# Patient Record
Sex: Female | Born: 1950 | Race: White | Hispanic: No | Smoking: Never smoker
Health system: Southern US, Community
[De-identification: ages and names within clinical notes are randomized; demographics above are authoritative.]

---

## 2006-07-06 ENCOUNTER — Encounter: Admission: RE | Admit: 2006-07-06 | Discharge: 2006-07-06 | Payer: Self-pay | Admitting: *Deleted

## 2006-08-29 ENCOUNTER — Encounter (INDEPENDENT_AMBULATORY_CARE_PROVIDER_SITE_OTHER): Payer: Self-pay | Admitting: Interventional Radiology

## 2006-08-29 ENCOUNTER — Other Ambulatory Visit: Admission: RE | Admit: 2006-08-29 | Discharge: 2006-08-29 | Payer: Self-pay | Admitting: Interventional Radiology

## 2006-08-29 ENCOUNTER — Encounter: Admission: RE | Admit: 2006-08-29 | Discharge: 2006-08-29 | Payer: Self-pay | Admitting: Endocrinology

## 2008-02-01 ENCOUNTER — Encounter: Admission: RE | Admit: 2008-02-01 | Discharge: 2008-02-01 | Payer: Self-pay | Admitting: Endocrinology

## 2008-12-20 ENCOUNTER — Emergency Department (HOSPITAL_COMMUNITY): Admission: EM | Admit: 2008-12-20 | Discharge: 2008-12-20 | Payer: Self-pay | Admitting: Emergency Medicine

## 2009-11-17 ENCOUNTER — Encounter: Admission: RE | Admit: 2009-11-17 | Discharge: 2009-11-17 | Payer: Self-pay | Admitting: Obstetrics and Gynecology

## 2010-01-01 ENCOUNTER — Encounter: Admission: RE | Admit: 2010-01-01 | Discharge: 2010-01-01 | Payer: Self-pay | Admitting: Endocrinology

## 2010-05-02 ENCOUNTER — Encounter: Payer: Self-pay | Admitting: Endocrinology

## 2010-05-02 ENCOUNTER — Encounter: Payer: Self-pay | Admitting: *Deleted

## 2010-12-20 ENCOUNTER — Other Ambulatory Visit: Payer: Self-pay | Admitting: Endocrinology

## 2010-12-20 DIAGNOSIS — E049 Nontoxic goiter, unspecified: Secondary | ICD-10-CM

## 2011-01-17 ENCOUNTER — Other Ambulatory Visit: Payer: Self-pay

## 2011-01-24 ENCOUNTER — Ambulatory Visit
Admission: RE | Admit: 2011-01-24 | Discharge: 2011-01-24 | Disposition: A | Discharge status: Home / Self Care | Payer: 59 | Source: Ambulatory Visit | Attending: Endocrinology | Admitting: Endocrinology

## 2011-01-24 DIAGNOSIS — E049 Nontoxic goiter, unspecified: Secondary | ICD-10-CM

## 2012-03-28 ENCOUNTER — Other Ambulatory Visit: Payer: Self-pay | Admitting: Endocrinology

## 2012-03-28 DIAGNOSIS — E049 Nontoxic goiter, unspecified: Secondary | ICD-10-CM

## 2012-04-18 ENCOUNTER — Ambulatory Visit
Admission: RE | Admit: 2012-04-18 | Discharge: 2012-04-18 | Disposition: A | Discharge status: Home / Self Care | Payer: PRIVATE HEALTH INSURANCE | Source: Ambulatory Visit | Attending: Endocrinology | Admitting: Endocrinology

## 2012-04-18 ENCOUNTER — Other Ambulatory Visit: Payer: 59

## 2012-04-18 DIAGNOSIS — E049 Nontoxic goiter, unspecified: Secondary | ICD-10-CM

## 2012-04-25 ENCOUNTER — Other Ambulatory Visit: Payer: 59

## 2014-04-21 ENCOUNTER — Other Ambulatory Visit: Payer: Self-pay | Admitting: Endocrinology

## 2014-04-21 DIAGNOSIS — E049 Nontoxic goiter, unspecified: Secondary | ICD-10-CM

## 2015-04-20 ENCOUNTER — Other Ambulatory Visit: Payer: PRIVATE HEALTH INSURANCE

## 2015-04-22 ENCOUNTER — Ambulatory Visit
Admission: RE | Admit: 2015-04-22 | Discharge: 2015-04-22 | Disposition: A | Discharge status: Home / Self Care | Payer: 59 | Source: Ambulatory Visit | Attending: Endocrinology | Admitting: Endocrinology

## 2015-04-22 DIAGNOSIS — E049 Nontoxic goiter, unspecified: Secondary | ICD-10-CM

## 2015-07-18 ENCOUNTER — Emergency Department (HOSPITAL_COMMUNITY): Payer: 59

## 2015-07-18 ENCOUNTER — Encounter (HOSPITAL_COMMUNITY): Payer: Self-pay | Admitting: *Deleted

## 2015-07-18 ENCOUNTER — Emergency Department (HOSPITAL_COMMUNITY)
Admission: EM | Admit: 2015-07-18 | Discharge: 2015-07-18 | Disposition: A | Discharge status: Home / Self Care | Payer: 59 | Attending: Emergency Medicine | Admitting: Emergency Medicine

## 2015-07-18 DIAGNOSIS — Y9289 Other specified places as the place of occurrence of the external cause: Secondary | ICD-10-CM | POA: Diagnosis not present

## 2015-07-18 DIAGNOSIS — Y9389 Activity, other specified: Secondary | ICD-10-CM | POA: Insufficient documentation

## 2015-07-18 DIAGNOSIS — Y998 Other external cause status: Secondary | ICD-10-CM | POA: Insufficient documentation

## 2015-07-18 DIAGNOSIS — S52571A Other intraarticular fracture of lower end of right radius, initial encounter for closed fracture: Secondary | ICD-10-CM | POA: Diagnosis not present

## 2015-07-18 DIAGNOSIS — W1789XA Other fall from one level to another, initial encounter: Secondary | ICD-10-CM | POA: Diagnosis not present

## 2015-07-18 DIAGNOSIS — S52501A Unspecified fracture of the lower end of right radius, initial encounter for closed fracture: Secondary | ICD-10-CM

## 2015-07-18 DIAGNOSIS — S6991XA Unspecified injury of right wrist, hand and finger(s), initial encounter: Secondary | ICD-10-CM | POA: Diagnosis present

## 2015-07-18 MED ORDER — OXYCODONE-ACETAMINOPHEN 5-325 MG PO TABS: 1.0000 | ORAL_TABLET | ORAL | Status: AC | PRN

## 2015-07-18 MED ORDER — ONDANSETRON 4 MG PO TBDP
8.0000 mg | ORAL_TABLET | Freq: Once | ORAL | Status: AC
  Administered 2015-07-18: 8 mg via ORAL
  Filled 2015-07-18: qty 2

## 2015-07-18 MED ORDER — NAPROXEN 500 MG PO TABS: 500.0000 mg | ORAL_TABLET | Freq: Two times a day (BID) | ORAL | Status: AC

## 2015-07-18 MED ORDER — OXYCODONE-ACETAMINOPHEN 5-325 MG PO TABS
1.0000 | ORAL_TABLET | Freq: Once | ORAL | Status: AC
  Administered 2015-07-18: 1 via ORAL
  Filled 2015-07-18: qty 1

## 2015-07-18 NOTE — ED Provider Notes (Signed)
CSN: [id number]     Arrival date & time 07/18/15  1901 History   First MD Initiated Contact with Patient 07/18/15 1923     Chief Complaint  Patient presents with  . Wrist Injury     (Consider location/radiation/quality/duration/timing/severity/associated sxs/prior Treatment) HPI   Daisy Gonzales is a 65 y.o. female, patient with no pertinent past medical history, presenting to the ED with right wrist pain from a fall that occurred just PTA. Pt rates her pain at 10/10, throbbing, nonradiating. Patient states that she was scraping popcorn ceilings and, while sitting on top of her fridge, lost her balance and fell to the floor. Patient denies LOC or head trauma. Denies neuro deficits, previous injuries or surgeries to this wrist, or any other complaints or injuries.    History reviewed. No pertinent past medical history. History reviewed. No pertinent past surgical history. No family history on file. Social History  Substance Use Topics  . Smoking status: Never Smoker   . Smokeless tobacco: None  . Alcohol Use: No   OB History    No data available     Review of Systems  Musculoskeletal: Positive for joint swelling (right wrist) and arthralgias (right wrist).  Skin: Positive for color change.  Neurological: Negative for weakness and numbness.      Allergies  Review of patient's allergies indicates no known allergies.  Home Medications   Prior to Admission medications   Medication Sig Start Date End Date Taking? Authorizing Provider  naproxen (NAPROSYN) 500 MG tablet Take 1 tablet (500 mg total) by mouth 2 (two) times daily. 07/18/15   Shawn C Joy, PA-C  oxyCODONE-acetaminophen (PERCOCET/ROXICET) 5-325 MG tablet Take 1 tablet by mouth every 4 (four) hours as needed for severe pain. 07/18/15   Shawn C Joy, PA-C   BP 188/99 mmHg  Pulse 69  Temp(Src) 97.9 F (36.6 C) (Oral)  Resp 24  Ht  (1.626 m)  Wt 63.504 kg  BMI 24.02 kg/m2  SpO2 98% Physical Exam    Constitutional: She appears well-developed and well-nourished. No distress.  HENT:  Head: Normocephalic and atraumatic.  Eyes: Conjunctivae are normal.  Cardiovascular: Normal rate, regular rhythm and intact distal pulses.   Circulation intact distal to the injury, verified by capillary refill.  Pulmonary/Chest: Effort normal.  Musculoskeletal: She exhibits edema and tenderness.  Obvious deformity and swelling with a possible hematoma on the anterior right wrist. Range of motion intact in the patient's right fingers. Overall trauma exam reveals no other injuries.  Neurological: She is alert.  No sensory deficits. Strength 5 out of 5 in the right hand.  Skin: Skin is warm and dry. She is not diaphoretic.  Psychiatric: She has a normal mood and affect. Her behavior is normal.  Nursing note and vitals reviewed.   ED Course  .Splint Application Date/Time: 07/18/2015 8:59 PM Performed by: Anselm Pancoast Authorized by: Anselm Pancoast Consent: Verbal consent obtained. Risks and benefits: risks, benefits and alternatives were discussed Consent given by: patient Patient understanding: patient states understanding of the procedure being performed Patient consent: the patient's understanding of the procedure matches consent given Procedure consent: procedure consent matches procedure scheduled Patient identity confirmed: verbally with patient and arm band Location details: right wrist Splint type: thumb spica Supplies used: Ortho-Glass Post-procedure: The splinted body part was neurovascularly unchanged following the procedure. Patient tolerance: Patient tolerated the procedure well with no immediate complications Comments: Splint was placed by the Ortho Tech. I assessed before and after. I  was available for consult throughout.   (including critical care time) Labs Review Labs Reviewed - No data to display  Imaging Review Dg Forearm Right  07/18/2015  CLINICAL DATA:  R forearm pain distal;  edema and hematoma; deformity fall (fell off refrigerator removing popcorn ceiling) X today EXAM: RIGHT FOREARM - 2 VIEW COMPARISON:  None. FINDINGS: There is a fracture of the distal radius. It is comminuted, impacted and mildly displaced. No other fractures. Elbow joint is normally aligned. No dislocation of the wrist joints. IMPRESSION: Comminuted, mildly displaced and impacted distal radial fracture. Electronically Signed   By: Amie Portlandavid  Ormond M.D.   On: 07/18/2015 19:52   Dg Hand Complete Right  07/18/2015  CLINICAL DATA:  Right hand pain EXAM: RIGHT HAND - COMPLETE 3+ VIEW COMPARISON:  None. FINDINGS: Acute, comminuted intra-articular fracture of the distal radius is identified. No additional fractures or subluxations. No radiopaque foreign bodies identified. IMPRESSION: 1. Acute intra-articular fracture of the distal radius is identified. Electronically Signed   By: Signa Kellaylor  Stroud M.D.   On: 07/18/2015 19:53   I have personally reviewed and evaluated these images as part of my medical decision-making.   EKG Interpretation None          MDM   Final diagnoses:  Distal radial fracture, right, closed, initial encounter    Daisy Gonzales presents with a right wrist injury from a fall that occurred just prior to arrival.  Findings and plan of care discussed with Gwyneth SproutWhitney Plunkett, MD. Dr. Anitra LauthPlunkett personally evaluated and examined this patient.  This patient's presentation is suspicious for a wrist fracture. X-ray confirms this, showing an intra-articular fracture of the distal radius. Patient's pain controlled with moderate measures. Patient is neurovascularly intact distal to the injury. Hand consult warranted. 8:26 PM Spoke with Dr. Izora Ribasoley, hand surgeon, who recommended splinting the wrist, assuring the splint is not placed too tightly, and have the patient call the office to be seen on Monday, April 10.  Splint was placed and the recommendation for making an appointment was  communicated with the patient. Home care and return precautions discussed. Patient voiced understanding of these instructions and is comfortable with discharge.  Filed Vitals:   07/18/15 1905  BP: 188/99  Pulse: 69  Temp: 97.9 F (36.6 C)  TempSrc: Oral  Resp: 24  Height: 5\' 4"  (1.626 m)  Weight: 63.504 kg  SpO2: 98%        Anselm PancoastShawn C Joy, PA-C 07/18/15 2103  Gwyneth SproutWhitney Plunkett, MD 07/18/15 2133

## 2015-07-18 NOTE — Progress Notes (Signed)
Orthopedic Tech Progress Note Patient Details:  [facility] JobsChrist[facility] CenterBandy 08/17/1950 [id number] Applied fiberglass thumb spica splint to RUE.  Pulses, sensation, motion intact before and after splinting.  Capillary refill less than 2 seconds before and after splinting. Ortho Devices Type of Ortho Device: Thumb spica splint Splint Material: Fiberglass Ortho Device/Splint Location: RUE Ortho Device/Splint Interventions: Application   Lesle ChrisGilliland, Emrah Ariola L 07/18/2015, 9:19 PM

## 2015-07-18 NOTE — ED Notes (Signed)
The pt is c/o rt wrist pain she fell while repalcing popcorn ceilings just pta here.  Good radial pulse.  Splinted ice pack

## 2015-07-18 NOTE — Discharge Instructions (Signed)
You have been seen today for a wrist injury. There is a fracture in your wrist. You should follow up with the Hand Surgeon. Call his office on Monday, April 10 to set up an appointment for later that day. Follow up with PCP as needed. Return to ED should symptoms worsen.

## 2015-07-18 NOTE — ED Notes (Signed)
Patient left at this time with all belongings. 

## 2015-07-18 NOTE — ED Notes (Signed)
Paged ortho tech for short arm splint  

## 2016-05-09 ENCOUNTER — Other Ambulatory Visit: Payer: Self-pay | Admitting: Endocrinology

## 2016-05-09 DIAGNOSIS — Z1231 Encounter for screening mammogram for malignant neoplasm of breast: Secondary | ICD-10-CM

## 2016-05-09 DIAGNOSIS — R5381 Other malaise: Secondary | ICD-10-CM

## 2017-04-12 ENCOUNTER — Other Ambulatory Visit: Payer: Self-pay | Admitting: Endocrinology

## 2017-04-12 DIAGNOSIS — E049 Nontoxic goiter, unspecified: Secondary | ICD-10-CM

## 2017-05-03 ENCOUNTER — Ambulatory Visit
Admission: RE | Admit: 2017-05-03 | Discharge: 2017-05-03 | Disposition: A | Discharge status: Home / Self Care | Payer: Medicare Other | Source: Ambulatory Visit | Attending: Endocrinology | Admitting: Endocrinology

## 2017-05-03 DIAGNOSIS — E049 Nontoxic goiter, unspecified: Secondary | ICD-10-CM

## 2017-07-13 IMAGING — CR DG HAND COMPLETE 3+V*R*
3 series · 3 of 3 positions shown · non-contrast
Comparison: None.

CLINICAL DATA: Right hand pain

EXAM:
RIGHT HAND - COMPLETE 3+ VIEW

[hand pa]
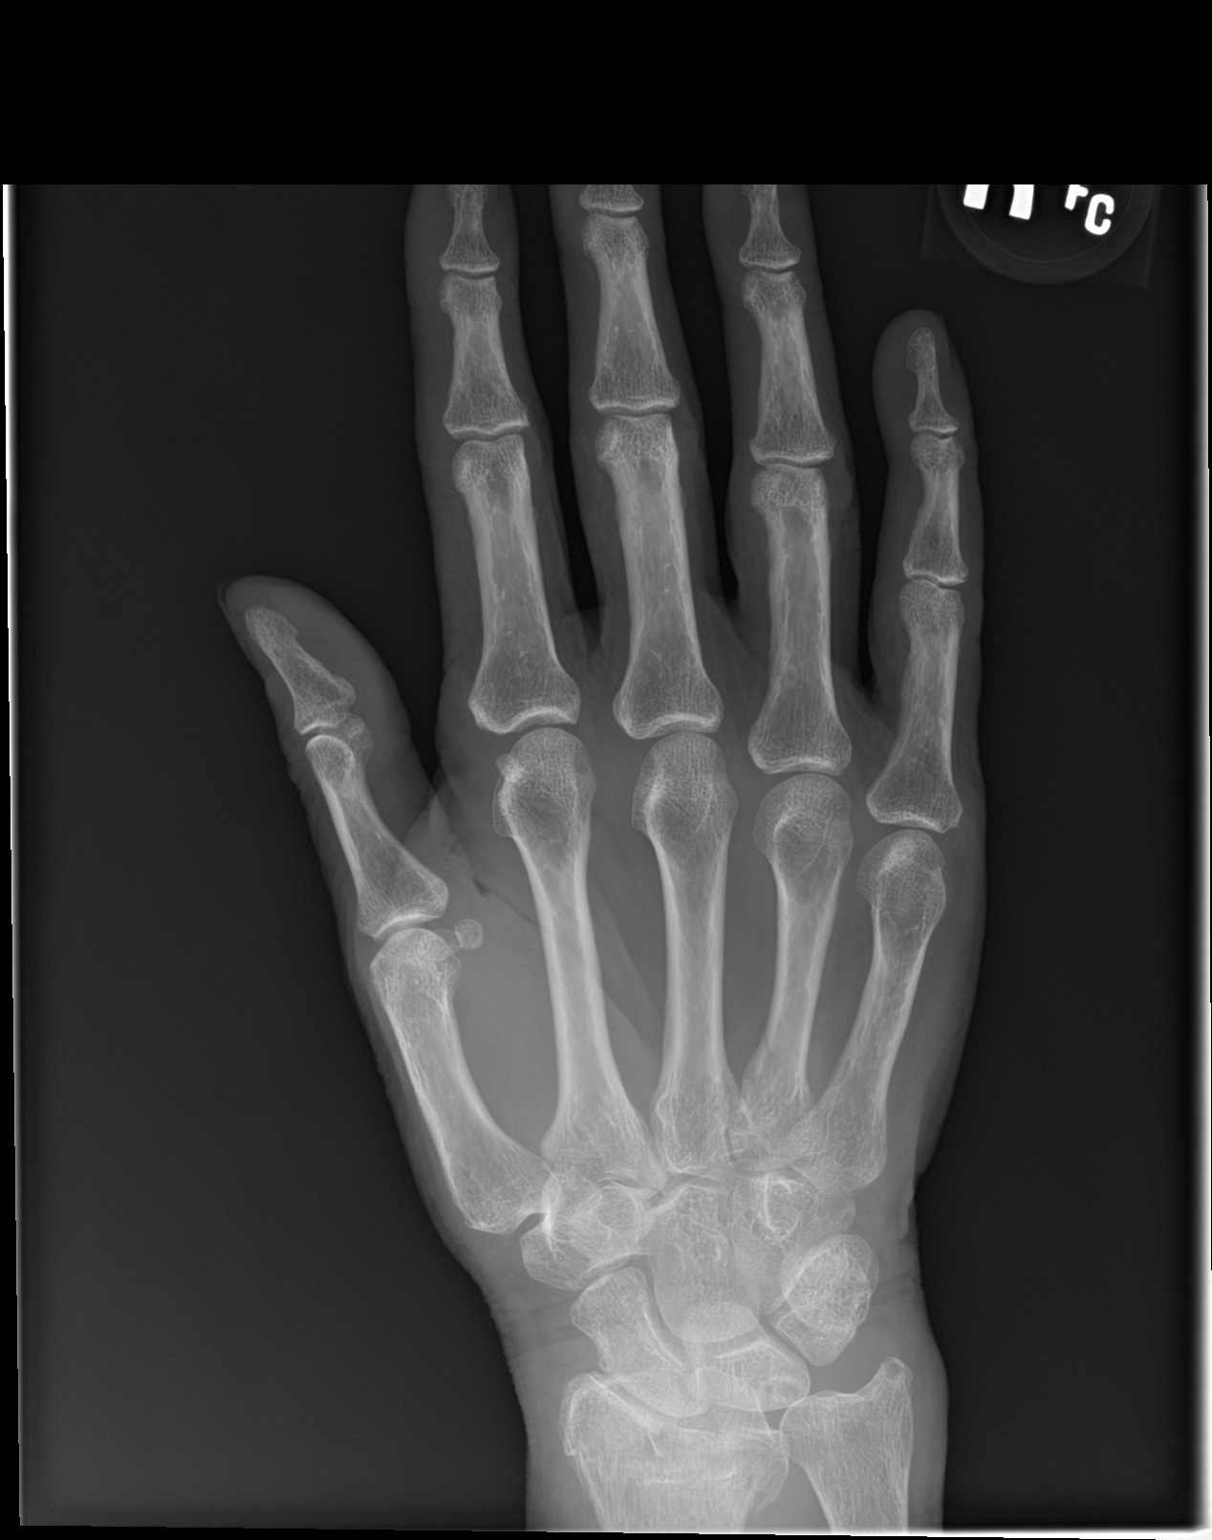

[hand obl]
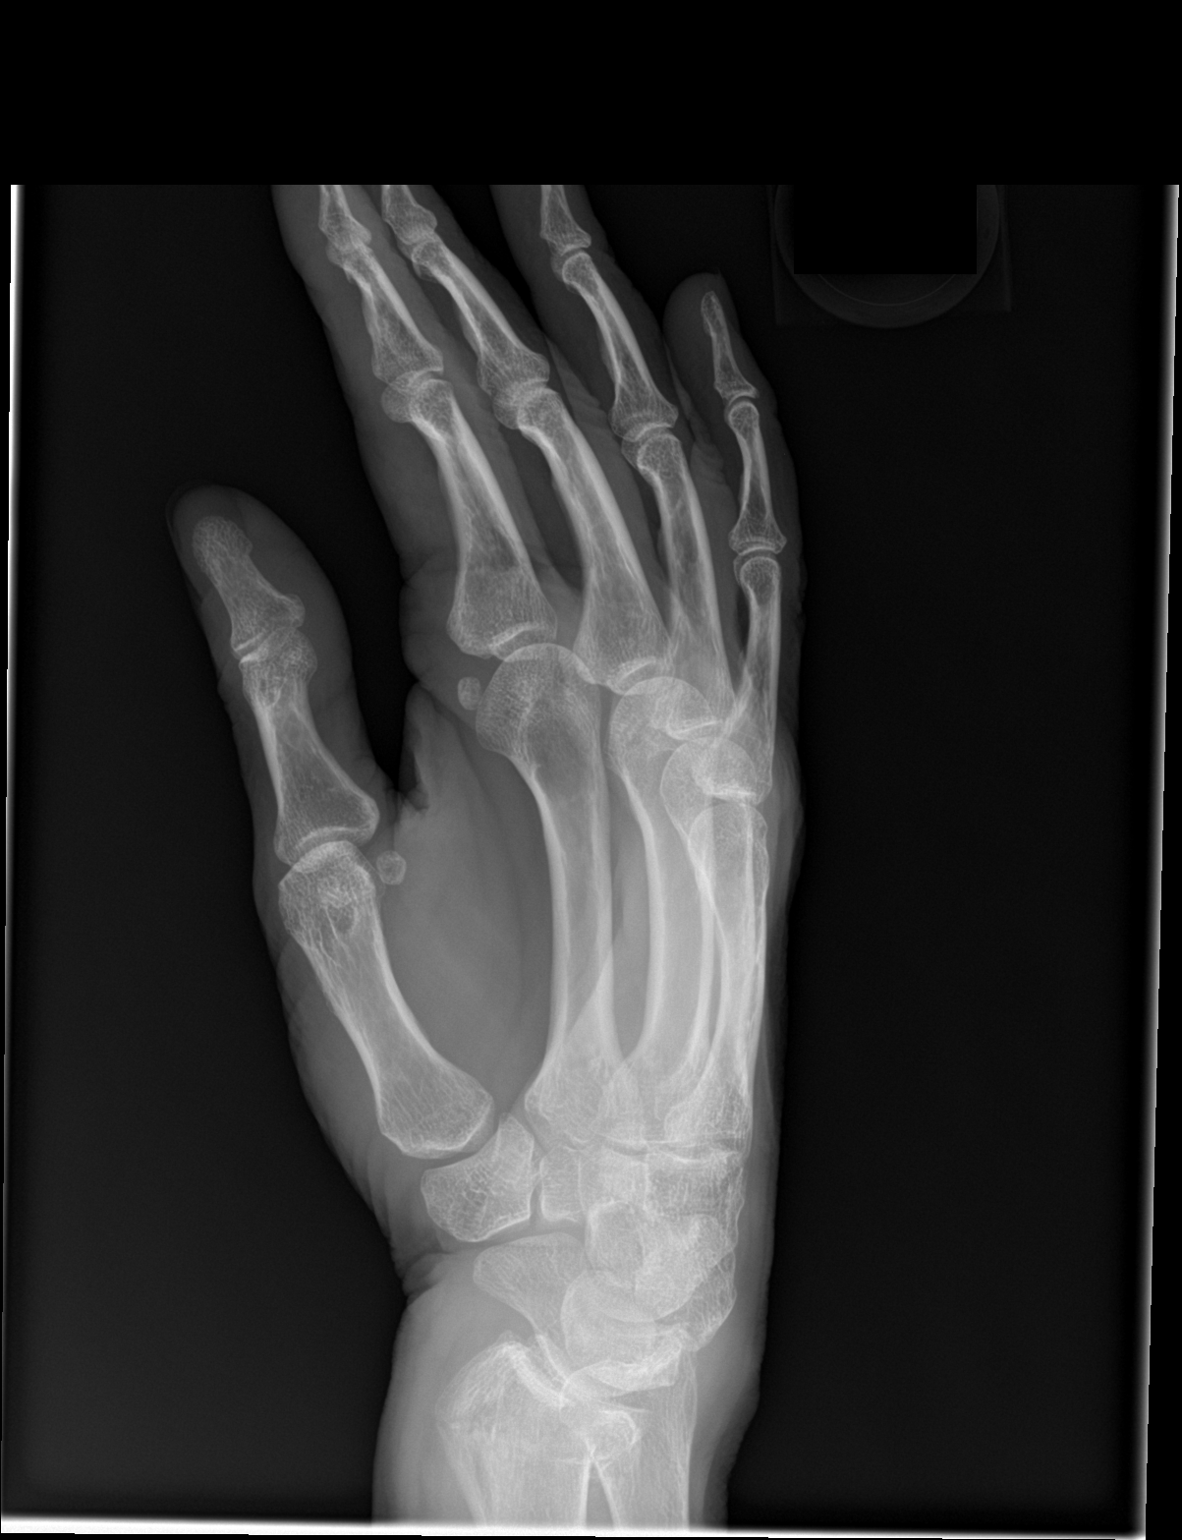

[hand lat]
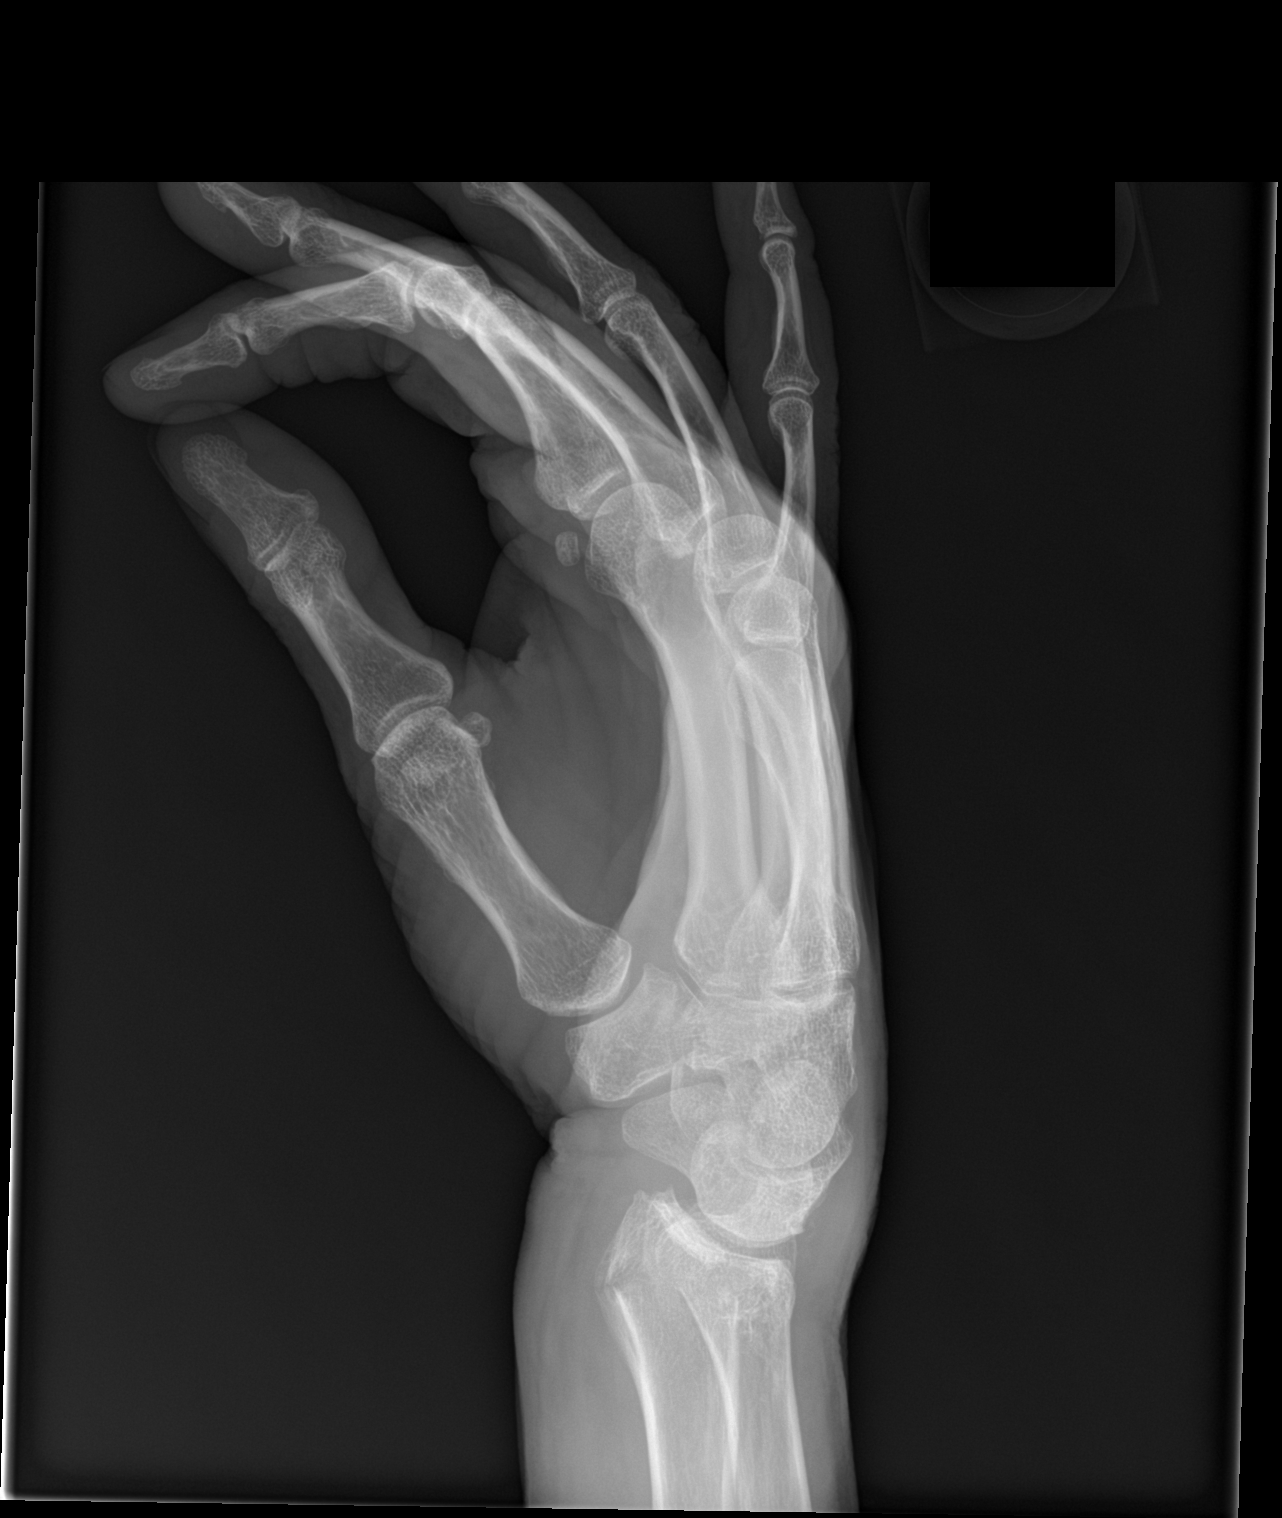

[3 of 3 positions shown; findings below may reference images not displayed]

FINDINGS: Acute, comminuted intra-articular fracture of the distal radius is
identified. No additional fractures or subluxations. No radiopaque
foreign bodies identified.
IMPRESSION: 1. Acute intra-articular fracture of the distal radius is
identified.

## 2018-05-10 ENCOUNTER — Other Ambulatory Visit: Payer: Self-pay | Admitting: Endocrinology

## 2018-05-10 DIAGNOSIS — E049 Nontoxic goiter, unspecified: Secondary | ICD-10-CM

## 2018-05-17 ENCOUNTER — Other Ambulatory Visit: Payer: 59

## 2018-06-05 ENCOUNTER — Ambulatory Visit
Admission: RE | Admit: 2018-06-05 | Discharge: 2018-06-05 | Disposition: A | Discharge status: Home / Self Care | Payer: 59 | Source: Ambulatory Visit | Attending: Endocrinology | Admitting: Endocrinology

## 2018-06-05 DIAGNOSIS — E049 Nontoxic goiter, unspecified: Secondary | ICD-10-CM

## 2019-05-11 ENCOUNTER — Other Ambulatory Visit: Payer: Self-pay | Admitting: Endocrinology

## 2019-05-11 DIAGNOSIS — E049 Nontoxic goiter, unspecified: Secondary | ICD-10-CM

## 2019-10-12 IMAGING — US US THYROID
1 series · 13 of 25 positions shown · non-contrast
Comparison: 04/22/2015 and previous back to 01/01/2010

CLINICAL DATA: Goiter. Previous FNA biopsy of inferior right and
mid left nodules nodule 08/29/2006.

EXAM:
THYROID ULTRASOUND
TECHNIQUE: Ultrasound examination of the thyroid gland and adjacent soft
tissues was performed.

[Series 1: us thyroid · 0.08mm/px · 13 of 60 slices shown]
[im 1/60]
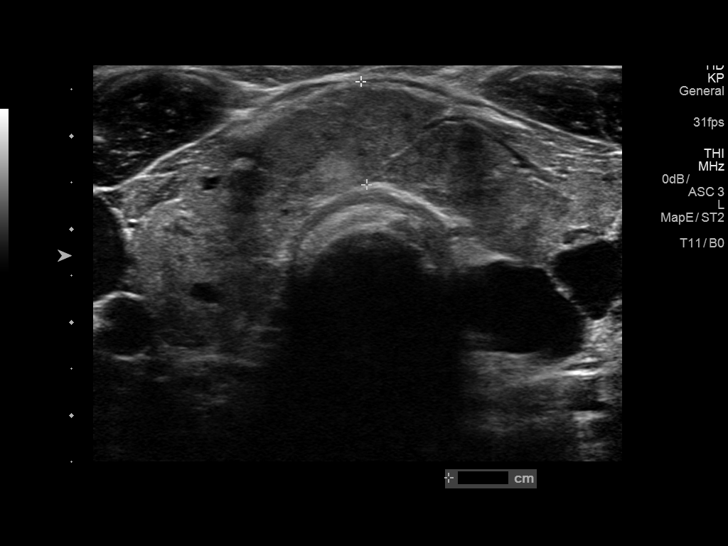
[im 5/60]
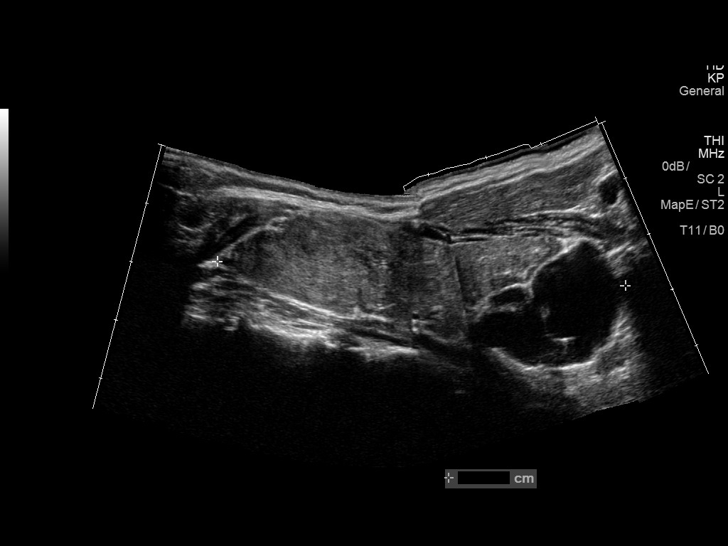
[im 10/60]
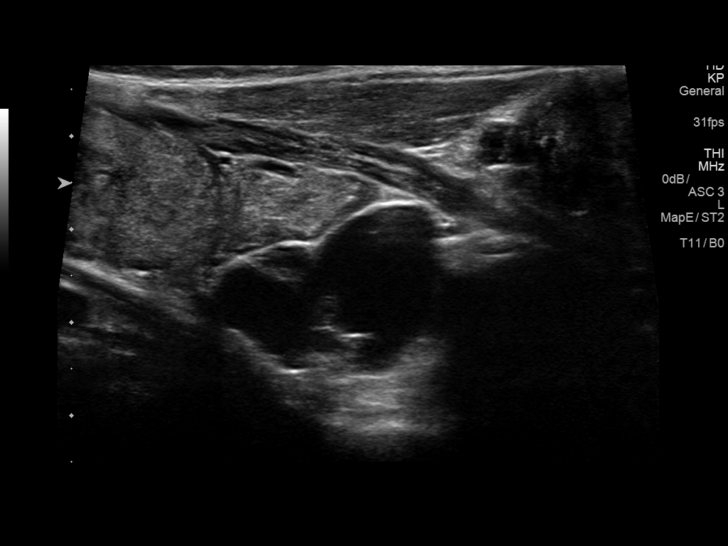
[im 15/60]
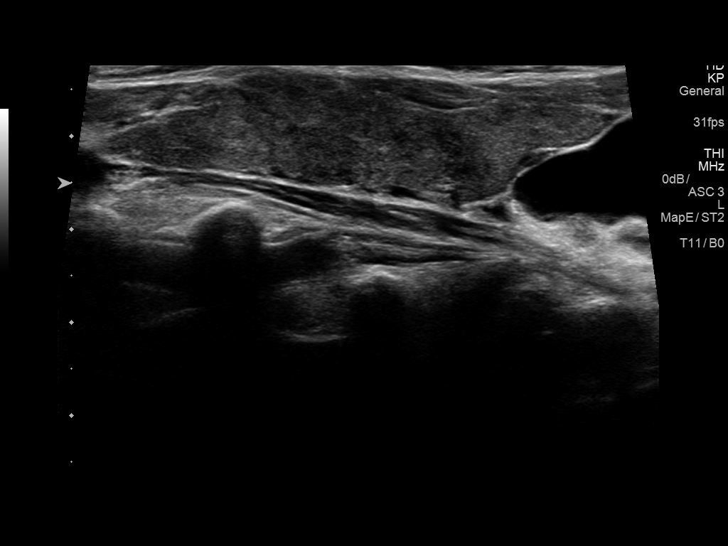
[im 20/60]
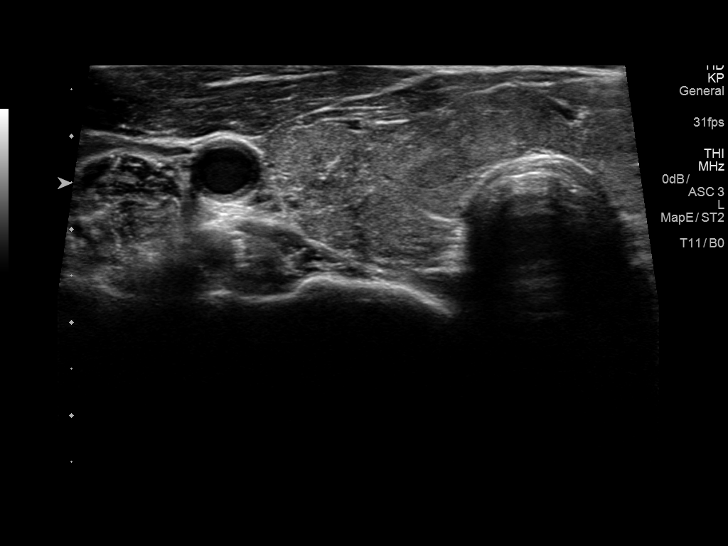
[im 25/60]
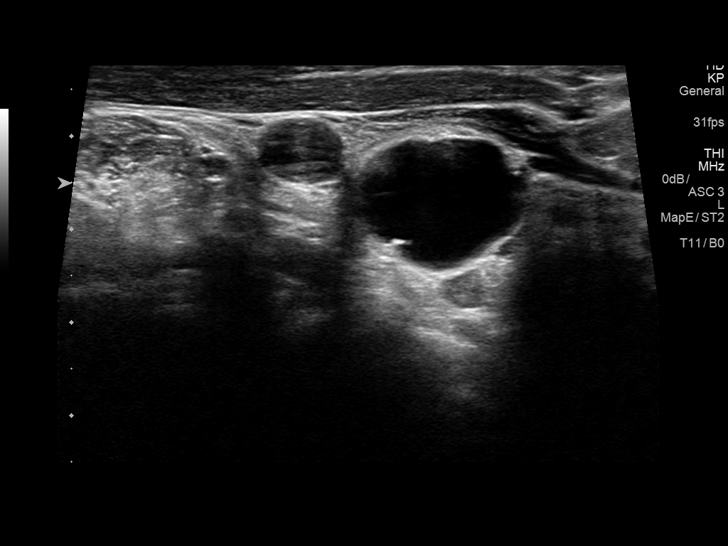
[im 30/60]
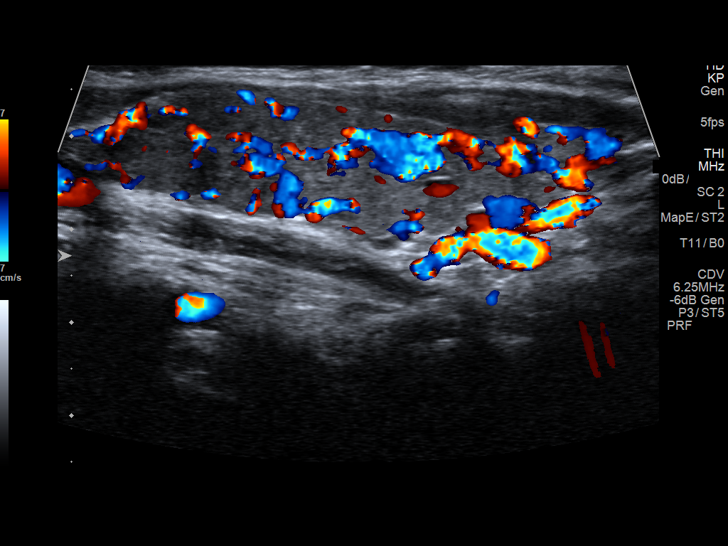
[im 35/60]
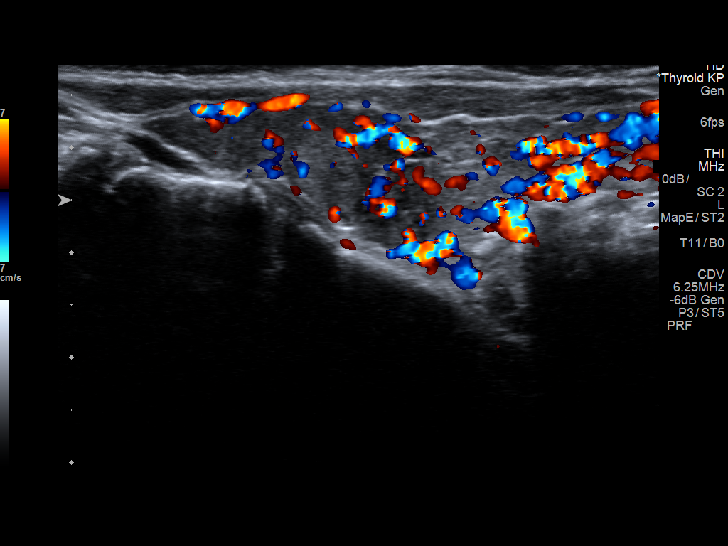
[im 40/60]
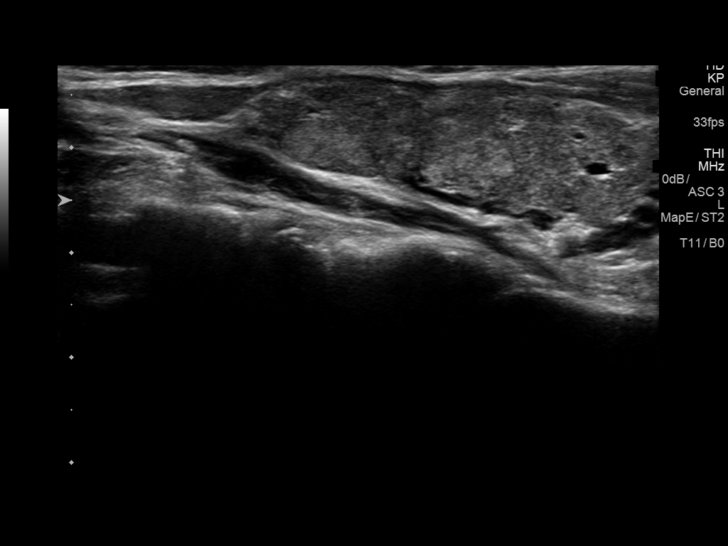
[im 45/60]
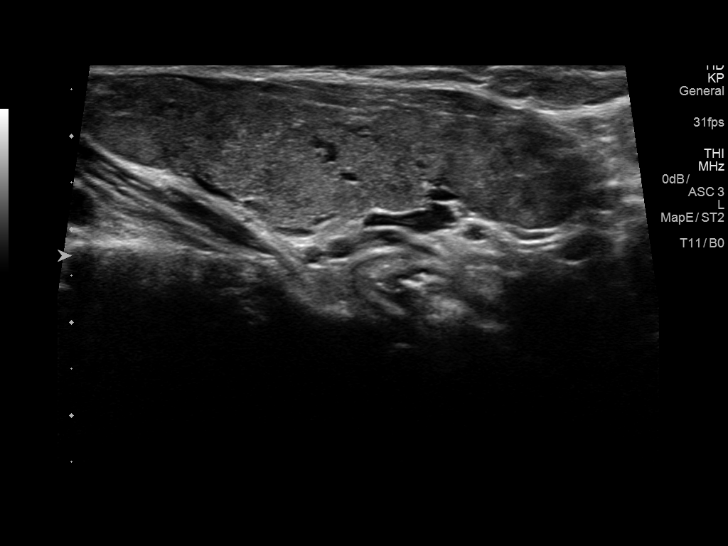
[im 50/60]
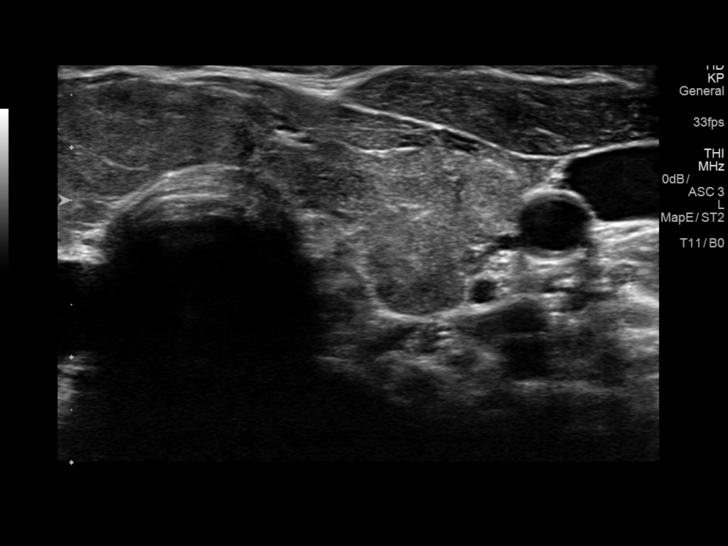
[im 55/60]
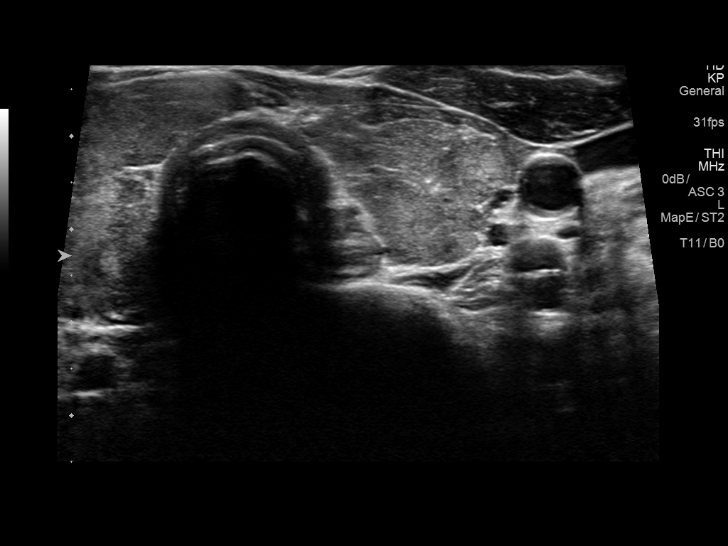
[im 60/60]
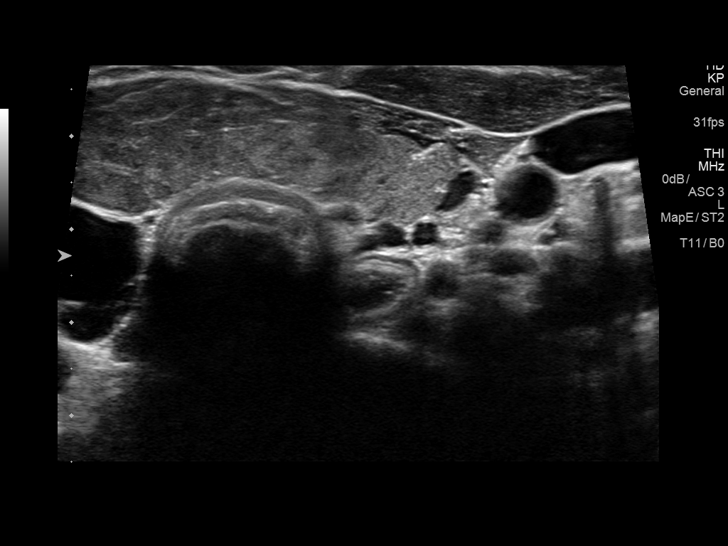

[13 of 25 positions shown; findings below may reference images not displayed]

FINDINGS: Parenchymal Echotexture: Moderately heterogenous

Isthmus: 1.1 cm thickness, previously

Right lobe: 6.8 x 1.7 x 2.3 cm, previously 6 x 2 x

Left lobe: 7.1 x 1.6 x 1.9 cm, previously 7.6 x 1.9 x

_________________________________________________________

Estimated total number of nodules >/= 1 cm: 3

Number of spongiform nodules >/=  2 cm not described below (TR1): 0

Number of mixed cystic and solid nodules >/= 1.5 cm not described
below (TR2): 0

_________________________________________________________

Nodule # 1:

Prior biopsy: No

Location: Right; Inferior

Maximum size: 2.6 cm; Other 2 dimensions: 1.7 x 1.9 cm, previously,
2.2 x 2.1 x 1.4 cm

Composition: cystic/almost completely cystic (0)

Echogenicity: hypoechoic (2)

Shape: not taller-than-wide (0)

Margins: smooth (0)

Echogenic foci: none (0)

ACR TI-RADS total points: 2.

ACR TI-RADS risk category:  TR2 (2 points).

Significant change in size (>/= 20% in two dimensions and minimal
increase of 2 mm): No

Change in features: No

Change in ACR TI-RADS risk category: No

ACR TI-RADS recommendations:

This nodule does NOT meet TI-RADS criteria for biopsy or dedicated
follow-up.

_________________________________________________________

1.1 x 0.6 x 0.8 cm mid right nodule, previously 1.3 x 1 x 1.2 on
04/18/2012

1.1 x 0.7 x 0.8 cm inferior left nodule, previously 1.2 x 0.9 x
on 04/18/2012
IMPRESSION: 1. Thyromegaly with bilateral nodules. Stability of small solid
nodules for greater than 5 years implies benignity. None meets
criteria for biopsy or dedicated imaging follow-up.

The above is in keeping with the ACR TI-RADS recommendations - [HOSPITAL] 2082;[DATE].

## 2020-06-25 ENCOUNTER — Other Ambulatory Visit: Payer: Self-pay | Admitting: Endocrinology

## 2020-06-25 DIAGNOSIS — E049 Nontoxic goiter, unspecified: Secondary | ICD-10-CM

## 2020-07-13 ENCOUNTER — Ambulatory Visit
Admission: RE | Admit: 2020-07-13 | Discharge: 2020-07-13 | Disposition: A | Discharge status: Home / Self Care | Payer: BC Managed Care – PPO | Source: Ambulatory Visit | Attending: Endocrinology | Admitting: Endocrinology

## 2020-07-13 DIAGNOSIS — E049 Nontoxic goiter, unspecified: Secondary | ICD-10-CM

## 2021-01-08 ENCOUNTER — Encounter (HOSPITAL_BASED_OUTPATIENT_CLINIC_OR_DEPARTMENT_OTHER): Payer: Self-pay

## 2021-01-08 ENCOUNTER — Other Ambulatory Visit (HOSPITAL_BASED_OUTPATIENT_CLINIC_OR_DEPARTMENT_OTHER): Payer: Self-pay

## 2021-01-08 ENCOUNTER — Other Ambulatory Visit: Payer: Self-pay

## 2021-01-08 ENCOUNTER — Emergency Department (HOSPITAL_BASED_OUTPATIENT_CLINIC_OR_DEPARTMENT_OTHER)
Admission: EM | Admit: 2021-01-08 | Discharge: 2021-01-08 | Disposition: A | Discharge status: Home / Self Care | Payer: BC Managed Care – PPO | Attending: Emergency Medicine | Admitting: Emergency Medicine

## 2021-01-08 DIAGNOSIS — H9201 Otalgia, right ear: Secondary | ICD-10-CM | POA: Insufficient documentation

## 2021-01-08 DIAGNOSIS — H73891 Other specified disorders of tympanic membrane, right ear: Secondary | ICD-10-CM | POA: Insufficient documentation

## 2021-01-08 DIAGNOSIS — U071 COVID-19: Secondary | ICD-10-CM | POA: Insufficient documentation

## 2021-01-08 DIAGNOSIS — G44209 Tension-type headache, unspecified, not intractable: Secondary | ICD-10-CM

## 2021-01-08 DIAGNOSIS — R519 Headache, unspecified: Secondary | ICD-10-CM | POA: Diagnosis present

## 2021-01-08 MED ORDER — SODIUM CHLORIDE 0.9 % IV SOLN: INTRAVENOUS | Status: DC

## 2021-01-08 MED ORDER — SODIUM CHLORIDE 0.9 % IV BOLUS
1000.0000 mL | Freq: Once | INTRAVENOUS | Status: AC
  Administered 2021-01-08: 1000 mL via INTRAVENOUS

## 2021-01-08 MED ORDER — METOCLOPRAMIDE HCL 5 MG/ML IJ SOLN
10.0000 mg | Freq: Once | INTRAMUSCULAR | Status: AC
  Administered 2021-01-08: 10 mg via INTRAVENOUS
  Filled 2021-01-08: qty 2

## 2021-01-08 MED ORDER — ONDANSETRON 4 MG PO TBDP
4.0000 mg | ORAL_TABLET | Freq: Three times a day (TID) | ORAL | 0 refills | Status: AC | PRN
  Filled 2021-01-08: qty 20, 7d supply, fill #0

## 2021-01-08 MED ORDER — DIPHENHYDRAMINE HCL 50 MG/ML IJ SOLN
12.5000 mg | Freq: Once | INTRAMUSCULAR | Status: AC
  Administered 2021-01-08: 12.5 mg via INTRAVENOUS
  Filled 2021-01-08: qty 1

## 2021-01-08 MED ORDER — ACETAMINOPHEN 500 MG PO TABS
1000.0000 mg | ORAL_TABLET | Freq: Once | ORAL | Status: AC
  Administered 2021-01-08: 1000 mg via ORAL
  Filled 2021-01-08: qty 2

## 2021-01-08 NOTE — ED Triage Notes (Addendum)
Reports test positive on 9/27 for covid and has since had a migraine headache and n/v.  Headache has been going on since last Thursday. Hasnt taken tylenol or motrin today and hasnt taken the antiviral she was prescribed due to nausea.

## 2021-01-08 NOTE — ED Provider Notes (Signed)
[facility] EMERGENCY DEPT Provider Note   CSN: [id number] Arrival date & time: 01/08/21  1137     History Chief Complaint  Patient presents with   Headache    Daisy Gonzales is a 70 y.o. female.   Headache Associated symptoms: ear pain, nausea and vomiting   Associated symptoms: no abdominal pain, no back pain, no cough, no eye pain, no fever, no seizures and no sore throat    70 year old female with recent diagnosis of COVID-19 on 9/27 presenting to the emergency department with headache, nausea and vomiting.  The patient has had an ongoing headache since her diagnosis of COVID-19 this past Tuesday.  She states that she has had significant sharp, shooting pain in a bandlike distribution across her forehead that has been gradual in onset.  She associates her headache with nausea and vomiting.  She has had decreased p.o. intake.  She has been able to take Tylenol or Motrin today due to her nausea and vomiting.  She is also unable to take her prescribed antiviral due to nausea.  She additionally endorses sharp shooting ear pain in her right ear.  History reviewed. No pertinent past medical history.  There are no problems to display for this patient.   History reviewed. No pertinent surgical history.   OB History   No obstetric history on file.     No family history on file.  Social History   Tobacco Use   Smoking status: Never   Smokeless tobacco: Never  Vaping Use   Vaping Use: Never used  Substance Use Topics   Alcohol use: Yes   Drug use: Never    Home Medications Prior to Admission medications   Medication Sig Start Date End Date Taking? Authorizing Provider  ondansetron (ZOFRAN ODT) 4 MG disintegrating tablet Take 1 tablet (4 mg total) by mouth every 8 (eight) hours as needed for nausea or vomiting. 01/08/21  Yes Ernie Avena, MD  naproxen (NAPROSYN) 500 MG tablet Take 1 tablet (500 mg total) by mouth 2 (two) times daily. 07/18/15   Joy, Shawn  C, PA-C  oxyCODONE-acetaminophen (PERCOCET/ROXICET) 5-325 MG tablet Take 1 tablet by mouth every 4 (four) hours as needed for severe pain. 07/18/15   Joy, Hillard Danker, PA-C    Allergies    Patient has no known allergies.  Review of Systems   Review of Systems  Constitutional:  Negative for chills and fever.  HENT:  Positive for ear pain. Negative for sore throat.   Eyes:  Negative for pain and visual disturbance.  Respiratory:  Negative for cough and shortness of breath.   Cardiovascular:  Negative for chest pain and palpitations.  Gastrointestinal:  Positive for nausea and vomiting. Negative for abdominal pain.  Genitourinary:  Negative for dysuria and hematuria.  Musculoskeletal:  Negative for arthralgias and back pain.  Skin:  Negative for color change and rash.  Neurological:  Positive for headaches. Negative for seizures and syncope.  All other systems reviewed and are negative.  Physical Exam Updated Vital Signs BP 130/86 (BP Location: Left Arm)   Pulse 64   Temp 99.5 F (37.5 C)   Resp 18   Ht 5\' 4"  (1.626 m)   Wt 59 kg   SpO2 98%   BMI 22.31 kg/m   Physical Exam Vitals and nursing note reviewed.  Constitutional:      General: She is not in acute distress.    Appearance: She is well-developed.  HENT:     Head: Normocephalic and  atraumatic.     Left Ear: Tympanic membrane, ear canal and external ear normal.     Ears:     Comments: Mildly erythematous TM on the right, no bulging or clear effusion present, canal normal Eyes:     Conjunctiva/sclera: Conjunctivae normal.  Cardiovascular:     Rate and Rhythm: Normal rate and regular rhythm.     Heart sounds: No murmur heard. Pulmonary:     Effort: Pulmonary effort is normal. No respiratory distress.     Breath sounds: Normal breath sounds.  Abdominal:     Palpations: Abdomen is soft.     Tenderness: There is no abdominal tenderness.  Musculoskeletal:     Cervical back: Neck supple.  Skin:    General: Skin is warm  and dry.  Neurological:     General: No focal deficit present.     Mental Status: She is alert. Mental status is at baseline.    ED Results / Procedures / Treatments   Labs (all labs ordered are listed, but only abnormal results are displayed) Labs Reviewed - No data to display  EKG None  Radiology No results found.  Procedures Procedures   Medications Ordered in ED Medications  sodium chloride 0.9 % bolus 1,000 mL (1,000 mLs Intravenous New Bag/Given 01/08/21 1404)    And  0.9 %  sodium chloride infusion (has no administration in time range)  metoCLOPramide (REGLAN) injection 10 mg (10 mg Intravenous Given 01/08/21 1407)  diphenhydrAMINE (BENADRYL) injection 12.5 mg (12.5 mg Intravenous Given 01/08/21 1405)  acetaminophen (TYLENOL) tablet 1,000 mg (1,000 mg Oral Given 01/08/21 1404)    ED Course  I have reviewed the triage vital signs and the nursing notes.  Pertinent labs & imaging results that were available during my care of the patient were reviewed by me and considered in my medical decision making (see chart for details).    MDM Rules/Calculators/A&P                           70 year old female with recent diagnosis of COVID-19 on 9/27 presenting to the emergency department with headache, nausea and vomiting.  The patient has had an ongoing headache since her diagnosis of COVID-19 this past Tuesday.  She states that she has had significant sharp, shooting pain in a bandlike distribution across her forehead that has been gradual in onset.  She associates her headache with nausea and vomiting.  She has had decreased p.o. intake.  She has been able to take Tylenol or Motrin today due to her nausea and vomiting.  She is also unable to take her prescribed antiviral due to nausea.  She additionally endorses sharp shooting ear pain in her right ear.  On arrival, the patient was afebrile, hemodynamically stable, saturating well on room air.  An ambulatory pulse oximetry was  performed and the patient maintain oxygen saturations above 93% on room air.  She presented with a gradual onset headache that has been present in conjunction with her COVID-19 infection.  She has ear pain on the right but no clear evidence of bacterial otitis media.  She presents with symptoms that are most consistent with her diagnosis of COVID-19.  No meningeal findings, headache was not sudden onset or maximal in onset.  Low concern for acute intracranial hemorrhage.  I do not think CT imaging is warranted at this time.  She was provided a migraine cocktail in the emergency department.  On reevaluation, patient's symptoms had  markedly improved.  She was ambulatory in the emergency department and headache symptoms had mostly resolved with the above interventions.  She was provided a prescription for Zofran ODT and provided return precautions to the emergency department.  Final Clinical Impression(s) / ED Diagnoses Final diagnoses:  COVID-19  Acute non intractable tension-type headache    Rx / DC Orders ED Discharge Orders          Ordered    ondansetron (ZOFRAN ODT) 4 MG disintegrating tablet  Every 8 hours PRN        01/08/21 1456             Ernie Avena, MD 01/08/21 1457

## 2022-05-02 ENCOUNTER — Other Ambulatory Visit: Payer: Self-pay | Admitting: Endocrinology

## 2022-05-02 DIAGNOSIS — E049 Nontoxic goiter, unspecified: Secondary | ICD-10-CM

## 2022-06-13 ENCOUNTER — Ambulatory Visit
Admission: RE | Admit: 2022-06-13 | Discharge: 2022-06-13 | Disposition: A | Discharge status: Home / Self Care | Payer: BC Managed Care – PPO | Source: Ambulatory Visit | Attending: Endocrinology | Admitting: Endocrinology

## 2022-06-13 DIAGNOSIS — E049 Nontoxic goiter, unspecified: Secondary | ICD-10-CM

## 2022-09-21 ENCOUNTER — Ambulatory Visit (HOSPITAL_COMMUNITY)
Admission: RE | Admit: 2022-09-21 | Discharge: 2022-09-21 | Disposition: A | Discharge status: Home / Self Care | Payer: BC Managed Care – PPO | Source: Ambulatory Visit | Attending: Cardiovascular Disease | Admitting: Cardiovascular Disease

## 2022-09-21 ENCOUNTER — Other Ambulatory Visit (HOSPITAL_COMMUNITY): Payer: Self-pay | Admitting: Family Medicine

## 2022-09-21 DIAGNOSIS — R600 Localized edema: Secondary | ICD-10-CM | POA: Insufficient documentation

## 2022-11-07 ENCOUNTER — Ambulatory Visit: Payer: BC Managed Care – PPO | Attending: Internal Medicine | Admitting: Internal Medicine

## 2022-11-07 ENCOUNTER — Encounter: Payer: Self-pay | Admitting: Internal Medicine

## 2022-11-07 VITALS — BP 158/76 | HR 62 | Ht 63.0 in | Wt 144.8 lb

## 2022-11-07 DIAGNOSIS — R9431 Abnormal electrocardiogram [ECG] [EKG]: Secondary | ICD-10-CM

## 2022-11-07 NOTE — Patient Instructions (Signed)
Medication Instructions:  Your physician recommends that you continue on your current medications as directed. Please refer to the Current Medication list given to you today.  *If you need a refill on your cardiac medications before your next appointment, please call your pharmacy*   Lab Work: None needed  If you have labs (blood work) drawn today and your tests are completely normal, you will receive your results only by: MyChart Message (if you have MyChart) OR A paper copy in the mail If you have any lab test that is abnormal or we need to change your treatment, we will call you to review the results.    Follow-Up: At [facility], you and your health needs are our priority.  As part of our continuing mission to provide you with exceptional heart care, we have created designated Provider Care Teams.  These Care Teams include your primary Cardiologist (physician) and Advanced Practice Providers (APPs -  Physician Assistants and Nurse Practitioners) who all work together to provide you with the care you need, when you need it.  We recommend signing up for the patient portal called "MyChart".  Sign up information is provided on this After Visit Summary.  MyChart is used to connect with patients for Virtual Visits (Telemedicine).  Patients are able to view lab/test results, encounter notes, upcoming appointments, etc.  Non-urgent messages can be sent to your provider as well.   To learn more about what you can do with MyChart, go to [email or web address].    Your next appointment:   F/u as needed  Provider:   Maisie Fus, MD

## 2022-11-07 NOTE — Progress Notes (Signed)
Cardiology Office Note:    Date:  11/07/2022   ID:  Daisy Gonzales, DOB 1951-04-02, MRN [id number]  PCP:  Garlan Fillers, MD   [facility] Providers Cardiologist:  None     Referring MD: Willis Modena, NP   No chief complaint on file. "Abnormal EKG" Leg Swelling  History of Present Illness:    Daisy Gonzales is a 72 y.o. female with no hx of cardiac dx. Was seen by [facility]. She reported 3 months of chest tightness. No DOE. She also reported n/v, and palpitations. Her EKG was reported to show HR 51 bpm, and possible septal infarct.  I don't have the actual EKG and patient did not want to repeat it 2/2 cost.  - Leg swelling, primarily on right side. She is s/p dopplers that were negative for DVT. - Worse later in day - Calves swell up, become hard - New symptom: intermittent "pull" or "twinge" in right upper chest area (not constant, not necessarily during exercise)   Current Medications: Current Outpatient Medications on File Prior to Visit  Medication Sig Dispense Refill   naproxen (NAPROSYN) 500 MG tablet Take 1 tablet (500 mg total) by mouth 2 (two) times daily. 30 tablet 0   ondansetron (ZOFRAN ODT) 4 MG disintegrating tablet Take 1 tablet (4 mg total) by mouth every 8 (eight) hours as needed for nausea or vomiting. 20 tablet 0   oxyCODONE-acetaminophen (PERCOCET/ROXICET) 5-325 MG tablet Take 1 tablet by mouth every 4 (four) hours as needed for severe pain. 6 tablet 0   No current facility-administered medications on file prior to visit.     Allergies:   Patient has no known allergies.   Social History   Socioeconomic History   Marital status: Divorced    Spouse name: Not on file   Number of children: Not on file   Years of education: Not on file   Highest education level: Not on file  Occupational History   Not on file  Tobacco Use   Smoking status: Never   Smokeless tobacco: Never  Vaping Use   Vaping status: Never Used   Substance and Sexual Activity   Alcohol use: Yes   Drug use: Never   Sexual activity: Not on file  Other Topics Concern   Not on file  Social History Narrative   Not on file   Social Determinants of Health   Financial Resource Strain: Not on file  Food Insecurity: Not on file  Transportation Needs: Not on file  Physical Activity: Not on file  Stress: Not on file  Social Connections: Not on file     Family History: NA  ROS:   Please see the history of present illness.     All other systems reviewed and are negative.  EKGs/Labs/Other Studies Reviewed:    The following studies were reviewed today:       Recent Labs: No results found for requested labs within last 365 days.  Recent Lipid Panel No results found for: "CHOL", "TRIG", "HDL", "CHOLHDL", "VLDL", "LDLCALC", "LDLDIRECT"   Risk Assessment/Calculations:     Physical Exam:    VS:   Vitals:   11/07/22 0922  BP: (!) 158/76  Pulse: 62  SpO2: 98%    Wt Readings from Last 3 Encounters:  01/08/21 130 lb (59 kg)  07/18/15 140 lb (63.5 kg)     GEN:  Well nourished, well developed in no acute distress HEENT: Normal NECK: No JVD; No carotid bruits LYMPHATICS:  No lymphadenopathy CARDIAC: RRR, no murmurs, rubs, gallops RESPIRATORY:  Clear to auscultation without rales, wheezing or rhonchi  ABDOMEN: Soft, non-tender, non-distended MUSCULOSKELETAL:  No edema; No deformity  SKIN: Warm and dry NEUROLOGIC:  Alert and oriented x 3 PSYCHIATRIC:  Normal affect   ASSESSMENT:   Abnormal EKG - don't have the tracing. Septal infarct can at times be lead placement. HR is normal today. She has no symptoms of coronary disease. I have a low suspicion she had a prior MI  Leg swelling - no signs of CHF - dependant edema  CP: - MSK  PLAN:    In order of problems listed above:  Recommend compression stockings No further cardiac w/u      Medication Adjustments/Labs and Tests Ordered: Current medicines are  reviewed at length with the patient today.  Concerns regarding medicines are outlined above.  No orders of the defined types were placed in this encounter.  No orders of the defined types were placed in this encounter.   There are no Patient Instructions on file for this visit.   Signed, Maisie Fus, MD  11/07/2022 7:36 AM    [facility]

## 2023-06-05 ENCOUNTER — Other Ambulatory Visit: Payer: Self-pay | Admitting: Endocrinology

## 2023-06-05 DIAGNOSIS — E049 Nontoxic goiter, unspecified: Secondary | ICD-10-CM
